# Patient Record
Sex: Female | Born: 1985 | Race: White | Hispanic: No | Marital: Married | Smoking: Current every day smoker
Health system: Southern US, Community
[De-identification: ages and names within clinical notes are randomized; demographics above are authoritative.]

## PROBLEM LIST (undated history)

## (undated) DIAGNOSIS — N200 Calculus of kidney: Secondary | ICD-10-CM

## (undated) DIAGNOSIS — S0990XA Unspecified injury of head, initial encounter: Secondary | ICD-10-CM

---

## 2013-03-07 ENCOUNTER — Emergency Department (HOSPITAL_COMMUNITY)
Admission: EM | Admit: 2013-03-07 | Discharge: 2013-03-07 | Disposition: A | Discharge status: Home / Self Care | Payer: Self-pay | Attending: Emergency Medicine | Admitting: Emergency Medicine

## 2013-03-07 ENCOUNTER — Encounter (HOSPITAL_COMMUNITY): Payer: Self-pay

## 2013-03-07 DIAGNOSIS — Z87442 Personal history of urinary calculi: Secondary | ICD-10-CM | POA: Insufficient documentation

## 2013-03-07 DIAGNOSIS — Z880 Allergy status to penicillin: Secondary | ICD-10-CM | POA: Insufficient documentation

## 2013-03-07 DIAGNOSIS — Z3202 Encounter for pregnancy test, result negative: Secondary | ICD-10-CM | POA: Insufficient documentation

## 2013-03-07 DIAGNOSIS — N898 Other specified noninflammatory disorders of vagina: Secondary | ICD-10-CM | POA: Insufficient documentation

## 2013-03-07 DIAGNOSIS — F17200 Nicotine dependence, unspecified, uncomplicated: Secondary | ICD-10-CM | POA: Insufficient documentation

## 2013-03-07 DIAGNOSIS — N12 Tubulo-interstitial nephritis, not specified as acute or chronic: Secondary | ICD-10-CM | POA: Insufficient documentation

## 2013-03-07 HISTORY — DX: Calculus of kidney: N20.0

## 2013-03-07 LAB — WET PREP, GENITAL
Trich, Wet Prep: NONE SEEN
WBC, Wet Prep HPF POC: NONE SEEN
Yeast Wet Prep HPF POC: NONE SEEN

## 2013-03-07 LAB — URINALYSIS, ROUTINE W REFLEX MICROSCOPIC
Glucose, UA: NEGATIVE mg/dL
Ketones, ur: NEGATIVE mg/dL
Protein, ur: NEGATIVE mg/dL
Urobilinogen, UA: 0.2 mg/dL (ref 0.0–1.0)

## 2013-03-07 LAB — URINE MICROSCOPIC-ADD ON

## 2013-03-07 LAB — PREGNANCY, URINE: Preg Test, Ur: NEGATIVE

## 2013-03-07 MED ORDER — IBUPROFEN 600 MG PO TABS: 600.0000 mg | ORAL_TABLET | Freq: Four times a day (QID) | ORAL | Status: DC | PRN

## 2013-03-07 MED ORDER — HYDROCODONE-ACETAMINOPHEN 5-325 MG PO TABS
2.0000 | ORAL_TABLET | Freq: Once | ORAL | Status: AC
  Administered 2013-03-07: 2 via ORAL
  Filled 2013-03-07: qty 2

## 2013-03-07 MED ORDER — CIPROFLOXACIN HCL 500 MG PO TABS: 500.0000 mg | ORAL_TABLET | Freq: Two times a day (BID) | ORAL | Status: DC

## 2013-03-07 MED ORDER — KETOROLAC TROMETHAMINE 60 MG/2ML IM SOLN
60.0000 mg | Freq: Once | INTRAMUSCULAR | Status: AC
  Administered 2013-03-07: 60 mg via INTRAMUSCULAR
  Filled 2013-03-07: qty 2

## 2013-03-07 MED ORDER — PHENAZOPYRIDINE HCL 200 MG PO TABS: 200.0000 mg | ORAL_TABLET | Freq: Three times a day (TID) | ORAL | Status: DC

## 2013-03-07 NOTE — ED Notes (Signed)
Onset started 1 week ago with dysuria and right flank pain. Hx of kidney stone, no blood in urine. White vaginal discharge. VSS

## 2013-03-07 NOTE — ED Provider Notes (Signed)
Medical screening examination/treatment/procedure(s) were performed by non-physician practitioner and as supervising physician I was immediately available for consultation/collaboration.   Gavin Pound. Dorthey Depace, MD 03/07/13 1428

## 2013-03-07 NOTE — ED Notes (Signed)
She tells me she has had right flank and rlq area pain plus dysuria x ~ 4 days.  She further tells me she had a labial abscess which with the assistance of her significant other, was able to "pop" 3 days ago, and "that feels better now".  She also tells me she has had an intermittant, thick "whitish" vaginal d/c "for about a month now".  She denies fever/h/a/n/v/d and is in no distress.  She also c/o mild u.r.i. Sx for past few days.

## 2013-03-07 NOTE — ED Provider Notes (Signed)
CSN: [id number]     Arrival date & time 03/07/13  1220 History   First MD Initiated Contact with Patient 03/07/13 1235     Chief Complaint  Patient presents with  . Dysuria  . Back Pain   (Consider location/radiation/quality/duration/timing/severity/associated sxs/prior Treatment) HPI Pt is a 27yo female c/o dysuria. Pt reports burning with urination, associated with right sided flank pain for about 4 days.  Flank pain is intermittent throbbing 5/10, nothing makes it better or worse. Pt reports hx of labial abscess 1 week ago, which her significant other helped her "pop" 3 days ago, causing green discharge to come out.  Reports pain from that has resolved.   Pt also reports malodorous white discharge intermittently for x1-23mo now.  Pt read on the Internet it could be a bacterial infection so she tried to change her diet, which seemed to help at first but it's still not completely resolved.  Does report hx of kidney stone and states right flank pain feels similar.  Denies fever but reports mild URI symptoms for last few days. Denies vomiting or diarrhea. LMP: ended 3 days ago, nl per pt.    Past Medical History  Diagnosis Date  . Kidney stone    Past Surgical History  Procedure Laterality Date  . Cesarean section     History reviewed. No pertinent family history. History  Substance Use Topics  . Smoking status: Current Every Day Smoker  . Smokeless tobacco: Not on file  . Alcohol Use: No   OB History   Grav Para Term Preterm Abortions TAB SAB Ect Mult Living                 Review of Systems  Constitutional: Negative for fever and chills.  Respiratory: Negative for cough.   Cardiovascular: Negative for chest pain.  Gastrointestinal: Positive for nausea. Negative for vomiting, diarrhea and constipation.  Genitourinary: Positive for dysuria, frequency and vaginal discharge. Negative for hematuria, vaginal bleeding, vaginal pain and pelvic pain.  Musculoskeletal: Positive for back  pain ( right flank).  All other systems reviewed and are negative.    Allergies  Penicillins  Home Medications   Current Outpatient Rx  Name  Route  Sig  Dispense  Refill  . Aspirin-Acetaminophen-Caffeine (GOODY HEADACHE PO)   Oral   Take 1 packet by mouth daily as needed (HEADACHES).         . diphenhydrAMINE (BENADRYL) 25 MG tablet   Oral   Take 25 mg by mouth every 6 (six) hours as needed for itching.         . ciprofloxacin (CIPRO) 500 MG tablet   Oral   Take 1 tablet (500 mg total) by mouth every 12 (twelve) hours.   20 tablet   0   . ibuprofen (ADVIL,MOTRIN) 600 MG tablet   Oral   Take 1 tablet (600 mg total) by mouth every 6 (six) hours as needed for pain.   30 tablet   0   . phenazopyridine (PYRIDIUM) 200 MG tablet   Oral   Take 1 tablet (200 mg total) by mouth 3 (three) times daily.   6 tablet   0    BP 114/68  Pulse 81  Temp(Src) 97.5 F (36.4 C) (Oral)  Resp 18  SpO2 100%  LMP 03/01/2013 Physical Exam  Nursing note and vitals reviewed. Constitutional: She appears well-developed and well-nourished. No distress.  Pt lying comfortably in exam bed, NAD.    HENT:  Head: Normocephalic and atraumatic.  Eyes: Conjunctivae are normal. No scleral icterus.  Neck: Normal range of motion.  Cardiovascular: Normal rate, regular rhythm and normal heart sounds.   Pulmonary/Chest: Effort normal and breath sounds normal. No respiratory distress. She has no wheezes. She has no rales. She exhibits no tenderness.  Abdominal: Soft. Bowel sounds are normal. She exhibits no distension and no mass. There is tenderness ( mild, right flank and RLQ). There is no rebound and no guarding.  Genitourinary: Vagina normal and uterus normal. Pelvic exam was performed with patient supine. No labial fusion. There is no rash, tenderness, lesion or injury on the right labia. There is no rash, tenderness, lesion or injury on the left labia. Cervix exhibits discharge ( scant, white).  Cervix exhibits no motion tenderness and no friability. Right adnexum displays tenderness. Right adnexum displays no mass and no fullness. Left adnexum displays no mass, no tenderness and no fullness. No erythema, tenderness or bleeding around the vagina. No foreign body around the vagina. No signs of injury around the vagina. No vaginal discharge found.  Nl external exam, no erythema, tenderness or lesions. Internal: scant white discharge, no blood. No CMT adnexal tenderness. Mild right adnexal tenderness.  Musculoskeletal: Normal range of motion.  Neurological: She is alert.  Skin: Skin is warm and dry. She is not diaphoretic.    ED Course  Procedures (including critical care time) Labs Review Labs Reviewed  WET PREP, GENITAL - Abnormal; Notable for the following:    Clue Cells Wet Prep HPF POC FEW (*)    All other components within normal limits  URINALYSIS, ROUTINE W REFLEX MICROSCOPIC - Abnormal; Notable for the following:    APPearance TURBID (*)    Hgb urine dipstick TRACE (*)    Nitrite POSITIVE (*)    All other components within normal limits  URINE MICROSCOPIC-ADD ON - Abnormal; Notable for the following:    Squamous Epithelial / LPF MANY (*)    Bacteria, UA MANY (*)    All other components within normal limits  GC/CHLAMYDIA PROBE AMP  URINE CULTURE  PREGNANCY, URINE   Imaging Review No results found.  MDM   1. Pyelonephritis     Symptoms and UA consistent with pyelonephritis.  Pt appears non-toxic, vitals WNL at this time. Pt may be discharged home for outpatient tx. Will tx and send urine culture. Wet prep: unremarkable.    Rx: Cipro, pyridium and ibuprofen. All labs/imaging/findings discussed with patient. All questions answered and concerns addressed. Will discharge pt home and have pt f/u with [facility] info provided. Return precautions given. Pt verbalized understanding and agreement with tx plan. Vitals: unremarkable. Discharged in stable  condition.    Discussed pt with attending during ED encounter and agrees with plan.     Junius Finner, PA-C 03/07/13 1426

## 2013-03-08 LAB — GC/CHLAMYDIA PROBE AMP
CT Probe RNA: NEGATIVE
GC Probe RNA: NEGATIVE

## 2013-03-09 LAB — URINE CULTURE: Colony Count: >=100000

## 2013-04-15 ENCOUNTER — Encounter (HOSPITAL_COMMUNITY): Payer: Self-pay | Admitting: Emergency Medicine

## 2013-04-15 ENCOUNTER — Emergency Department (HOSPITAL_COMMUNITY)
Admission: EM | Admit: 2013-04-15 | Discharge: 2013-04-15 | Discharge status: Against Medical Advice | Payer: Self-pay | Attending: Emergency Medicine | Admitting: Emergency Medicine

## 2013-04-15 DIAGNOSIS — N898 Other specified noninflammatory disorders of vagina: Secondary | ICD-10-CM | POA: Insufficient documentation

## 2013-04-15 DIAGNOSIS — N644 Mastodynia: Secondary | ICD-10-CM | POA: Insufficient documentation

## 2013-04-15 DIAGNOSIS — F17200 Nicotine dependence, unspecified, uncomplicated: Secondary | ICD-10-CM | POA: Insufficient documentation

## 2013-04-15 NOTE — ED Notes (Signed)
Unable to find pt. In our w.r.

## 2013-04-15 NOTE — ED Notes (Signed)
Pt states for the past 3 months she has been having abnormal vaginal bleeding between periods, breast soreness that goes all the way down body, states she's new to [place] and doesn't have insurance or an ob/gyn.

## 2013-04-15 NOTE — ED Notes (Signed)
Unable to locate pt. In our lobby.

## 2013-06-13 ENCOUNTER — Emergency Department (HOSPITAL_COMMUNITY)
Admission: EM | Admit: 2013-06-13 | Discharge: 2013-06-13 | Discharge status: Against Medical Advice | Payer: Self-pay | Attending: Emergency Medicine | Admitting: Emergency Medicine

## 2013-06-13 ENCOUNTER — Encounter (HOSPITAL_COMMUNITY): Payer: Self-pay | Admitting: Emergency Medicine

## 2013-06-13 DIAGNOSIS — N898 Other specified noninflammatory disorders of vagina: Secondary | ICD-10-CM | POA: Insufficient documentation

## 2013-06-13 DIAGNOSIS — Z880 Allergy status to penicillin: Secondary | ICD-10-CM | POA: Insufficient documentation

## 2013-06-13 DIAGNOSIS — R42 Dizziness and giddiness: Secondary | ICD-10-CM | POA: Insufficient documentation

## 2013-06-13 DIAGNOSIS — F17200 Nicotine dependence, unspecified, uncomplicated: Secondary | ICD-10-CM | POA: Insufficient documentation

## 2013-06-13 DIAGNOSIS — N939 Abnormal uterine and vaginal bleeding, unspecified: Secondary | ICD-10-CM

## 2013-06-13 DIAGNOSIS — Z87442 Personal history of urinary calculi: Secondary | ICD-10-CM | POA: Insufficient documentation

## 2013-06-13 NOTE — ED Notes (Signed)
Pt in bathroom, unavailable for blood draw.

## 2013-06-13 NOTE — ED Notes (Signed)
Pt states for the last 3 months, she has her regular cycle followed by a restart of lite bleeding a few days later for about a week. Last cycle a week ago, started spotting again yesterday

## 2013-06-13 NOTE — ED Notes (Signed)
Patient not present in room for over thirty minutes. Staff checked all bathrooms and did not find patient. Several tests remain undone due to patient leaving ED prior to discharge. Horton, MD, made aware; Johnny Bridge, charge RN made aware.

## 2013-06-13 NOTE — ED Notes (Signed)
Bed: ZO10 Expected date:  Expected time:  Means of arrival:  Comments: Pt dressing

## 2013-06-13 NOTE — ED Notes (Signed)
Patient still not present in room, gown left in room.

## 2013-06-13 NOTE — ED Provider Notes (Signed)
CSN: [id number]     Arrival date & time 06/13/13  1234 History   First MD Initiated Contact with Patient 06/13/13 1324     Chief Complaint  Patient presents with  . Vaginal Bleeding   (Consider location/radiation/quality/duration/timing/severity/associated sxs/prior Treatment) HPI  This is a 26 year old female who presents with vaginal bleeding. Patient reports 3 month history of irregular vaginal bleeding. She states that she has a normal period followed by absence of bleeding and then return of light spotting. She reports last normal menstrual period was one week ago. She reports onset of vaginal spotting last night. She also reports cramping of the lower abdomen. She is not taking anything at home for this. She does have an Implanon which she states was placed in 2008. She reports negative home pregnancy tests. She denies any syncope but does endorse dizziness.  Past Medical History  Diagnosis Date  . Kidney stone    Past Surgical History  Procedure Laterality Date  . Cesarean section     No family history on file. History  Substance Use Topics  . Smoking status: Current Every Day Smoker  . Smokeless tobacco: Not on file  . Alcohol Use: No   OB History   Grav Para Term Preterm Abortions TAB SAB Ect Mult Living                 Review of Systems  Constitutional: Negative for fever.  Respiratory: Negative for cough, chest tightness and shortness of breath.   Cardiovascular: Negative for chest pain.  Gastrointestinal: Positive for abdominal pain. Negative for nausea and vomiting.  Genitourinary: Positive for vaginal bleeding. Negative for dysuria, urgency, frequency and vaginal discharge.  Musculoskeletal: Negative for back pain.  Skin: Negative for wound.  Neurological: Positive for dizziness. Negative for headaches.  Psychiatric/Behavioral: Negative for confusion.  All other systems reviewed and are negative.    Allergies  Penicillins  Home Medications   Current  Outpatient Rx  Name  Route  Sig  Dispense  Refill  . diphenhydrAMINE (BENADRYL) 25 MG tablet   Oral   Take 25 mg by mouth every 6 (six) hours as needed for itching or allergies.           BP 104/57  Pulse 58  Temp(Src) 98.7 F (37.1 C) (Oral)  Resp 16  Wt 150 lb (68.04 kg)  SpO2 100%  LMP 06/03/2013 Physical Exam  Nursing note and vitals reviewed. Constitutional: She is oriented to person, place, and time. She appears well-developed and well-nourished. No distress.  HENT:  Head: Normocephalic and atraumatic.  Eyes: Pupils are equal, round, and reactive to light.  Neck: Neck supple.  Cardiovascular: Normal rate, regular rhythm and normal heart sounds.   No murmur heard. Pulmonary/Chest: Effort normal and breath sounds normal. No respiratory distress. She has no wheezes.  Abdominal: Soft. Bowel sounds are normal. There is no tenderness.  Neurological: She is alert and oriented to person, place, and time.  Skin: Skin is warm and dry.  Psychiatric: She has a normal mood and affect.    ED Course  Procedures (including critical care time) Labs Review Labs Reviewed  CBC WITH DIFFERENTIAL  BASIC METABOLIC PANEL  URINALYSIS, ROUTINE W REFLEX MICROSCOPIC   Imaging Review No results found.  EKG Interpretation   None       MDM  No diagnosis found.  Patient presents with complaints of irregular vaginal bleeding. She does also endorse dizziness without syncope. She is nontoxic-appearing on exam and vital signs are reassuring.  Workup initiated with lab work and orthostatic vital signs. Patient appears to have eloped prior to pelvic exam and formal evaluation.    Shon Baton, MD 06/13/13 [phone]

## 2013-09-21 ENCOUNTER — Encounter (HOSPITAL_COMMUNITY): Payer: Self-pay | Admitting: Emergency Medicine

## 2013-09-21 ENCOUNTER — Emergency Department (HOSPITAL_COMMUNITY)
Admission: EM | Admit: 2013-09-21 | Discharge: 2013-09-21 | Disposition: A | Discharge status: Home / Self Care | Payer: Self-pay | Attending: Emergency Medicine | Admitting: Emergency Medicine

## 2013-09-21 ENCOUNTER — Emergency Department (HOSPITAL_COMMUNITY): Payer: Self-pay

## 2013-09-21 DIAGNOSIS — Y9389 Activity, other specified: Secondary | ICD-10-CM | POA: Insufficient documentation

## 2013-09-21 DIAGNOSIS — Z791 Long term (current) use of non-steroidal anti-inflammatories (NSAID): Secondary | ICD-10-CM | POA: Insufficient documentation

## 2013-09-21 DIAGNOSIS — Z880 Allergy status to penicillin: Secondary | ICD-10-CM | POA: Insufficient documentation

## 2013-09-21 DIAGNOSIS — M25531 Pain in right wrist: Secondary | ICD-10-CM

## 2013-09-21 DIAGNOSIS — S59919A Unspecified injury of unspecified forearm, initial encounter: Principal | ICD-10-CM

## 2013-09-21 DIAGNOSIS — S6990XA Unspecified injury of unspecified wrist, hand and finger(s), initial encounter: Principal | ICD-10-CM | POA: Insufficient documentation

## 2013-09-21 DIAGNOSIS — S59909A Unspecified injury of unspecified elbow, initial encounter: Secondary | ICD-10-CM | POA: Insufficient documentation

## 2013-09-21 DIAGNOSIS — W208XXA Other cause of strike by thrown, projected or falling object, initial encounter: Secondary | ICD-10-CM | POA: Insufficient documentation

## 2013-09-21 DIAGNOSIS — Z87442 Personal history of urinary calculi: Secondary | ICD-10-CM | POA: Insufficient documentation

## 2013-09-21 DIAGNOSIS — Y990 Civilian activity done for income or pay: Secondary | ICD-10-CM | POA: Insufficient documentation

## 2013-09-21 DIAGNOSIS — F17200 Nicotine dependence, unspecified, uncomplicated: Secondary | ICD-10-CM | POA: Insufficient documentation

## 2013-09-21 DIAGNOSIS — Y9289 Other specified places as the place of occurrence of the external cause: Secondary | ICD-10-CM | POA: Insufficient documentation

## 2013-09-21 MED ORDER — ACETAMINOPHEN-CODEINE #3 300-30 MG PO TABS
1.0000 | ORAL_TABLET | Freq: Once | ORAL | Status: AC
  Administered 2013-09-21: 1 via ORAL
  Filled 2013-09-21: qty 1

## 2013-09-21 MED ORDER — IBUPROFEN 800 MG PO TABS: 800.0000 mg | ORAL_TABLET | Freq: Three times a day (TID) | ORAL | Status: DC

## 2013-09-21 MED ORDER — ACETAMINOPHEN-CODEINE #3 300-30 MG PO TABS: 1.0000 | ORAL_TABLET | Freq: Four times a day (QID) | ORAL | Status: AC | PRN

## 2013-09-21 NOTE — ED Provider Notes (Signed)
CSN: [id number]     Arrival date & time 09/21/13  1748 History   First MD Initiated Contact with Patient 09/21/13 1913     This chart was scribed for non-physician practitioner, Francee Piccolo PA-C working with Nelia Shi, MD by Arlan Organ, ED Scribe. This patient was seen in room WTR5/WTR5 and the patient's care was started at 7:18 PM.   Chief Complaint  Patient presents with  . Arm Injury    The history is provided by the patient. No language interpreter was used.    HPI Comments: Vanessa Carney is a 28 y.o. female who presents to the Emergency Department complaining of constant, moderate R arm pain x 8 hours that is is unchanged. She describes this pain as sore. Pt states a car battery fell on her arm while she was at work. She has not tried anything OTC for the discomfort. Denies any recent or previous surgeries, however, she admits to breaking her R thumb as a child. At this time she denies any fever, chills, or weakness. No other concerns this visit.  Past Medical History  Diagnosis Date  . Kidney stone    Past Surgical History  Procedure Laterality Date  . Cesarean section     History reviewed. No pertinent family history. History  Substance Use Topics  . Smoking status: Current Every Day Smoker  . Smokeless tobacco: Not on file  . Alcohol Use: No   OB History   Grav Para Term Preterm Abortions TAB SAB Ect Mult Living                 Review of Systems  Constitutional: Negative for fever and chills.  HENT: Negative for congestion.   Eyes: Negative for redness.  Respiratory: Negative for cough.   Musculoskeletal: Positive for arthralgias.  Skin: Negative for rash.  Neurological: Negative for weakness.      Allergies  Penicillins  Home Medications   Current Outpatient Rx  Name  Route  Sig  Dispense  Refill  . acetaminophen-codeine (TYLENOL #3) 300-30 MG per tablet   Oral   Take 1-2 tablets by mouth every 6 (six) hours as needed for severe pain.    10 tablet   0   . ibuprofen (ADVIL,MOTRIN) 800 MG tablet   Oral   Take 1 tablet (800 mg total) by mouth 3 (three) times daily.   21 tablet   0     Triage Vitals: BP 106/76  Pulse 75  Temp(Src) 98.1 F (36.7 C) (Oral)  SpO2 100%  LMP 09/14/2013   Physical Exam  Nursing note and vitals reviewed. Constitutional: She is oriented to person, place, and time. She appears well-developed and well-nourished. No distress.  HENT:  Head: Normocephalic and atraumatic.  Right Ear: External ear normal.  Left Ear: External ear normal.  Nose: Nose normal.  Mouth/Throat: Oropharynx is clear and moist.  Eyes: Conjunctivae are normal.  Neck: Normal range of motion. Neck supple.  Cardiovascular: Normal rate, regular rhythm, normal heart sounds and intact distal pulses.   Pulmonary/Chest: Effort normal and breath sounds normal. No respiratory distress. She has no wheezes. She has no rales.  Abdominal: Soft.  Musculoskeletal: Normal range of motion.       Right elbow: Normal.      Left elbow: Normal.       Right wrist: She exhibits tenderness. She exhibits normal range of motion, no bony tenderness, no swelling, no deformity and no laceration.       Left wrist:  Normal.       Right forearm: She exhibits tenderness. She exhibits no bony tenderness, no swelling and no deformity.       Left forearm: Normal.       Right hand: Normal.       Left hand: Normal.  Neurological: She is alert and oriented to person, place, and time.  Sensation grossly intact  Skin: Skin is warm and dry. She is not diaphoretic.  Psychiatric: She has a normal mood and affect.    ED Course  Procedures (including critical care time)  DIAGNOSTIC STUDIES: Oxygen Saturation is 100% on RA, Normal by my interpretation.    COORDINATION OF CARE: 7:19 PM- Will give Tylenol #3. Will order DG Forearm. Will prescribe Tylenol #3 and Advil at discharge. Discussed treatment plan with pt at bedside and pt agreed to plan.     Labs  Review Labs Reviewed - No data to display Imaging Review Dg Forearm Right  09/21/2013   CLINICAL DATA:  Dropped car battery on right forearm, forearm pain  EXAM: RIGHT FOREARM - 2 VIEW  COMPARISON:  None  FINDINGS: Osseous mineralization normal.  Joint spaces preserved.  No acute fracture, dislocation or bone destruction.  IMPRESSION: No acute osseous abnormalities.   Electronically Signed   By: Ulyses SouthwardMark  Boles M.D.   On: 09/21/2013 18:57     EKG Interpretation None      MDM   Final diagnoses:  Right wrist pain    Filed Vitals:   09/21/13 1826  BP: 106/76  Pulse: 75  Temp: 98.1 F (36.7 C)   Afebrile, NAD, non-toxic appearing, AAOx4.  Neurovascularly intact. Normal sensation. Imaging shows no fracture. Directed pt to ice injury, take acetaminophen or ibuprofen for pain, and to elevate and rest the injury when possible. Splinted wrist for support and comfort. Return precautions discussed. Patient agreeable to plan. Patient stable to be discharged.  I personally performed the services described in this documentation, which was scribed in my presence. The recorded information has been reviewed and is accurate.    Jeannetta EllisJennifer L Vayda Dungee, PA-C 09/21/13 2010

## 2013-09-21 NOTE — ED Notes (Signed)
Pt states she was at work when she dropped a car battery on her R arm and now has pain. Does not want to file worker's comp. Alert and oriented. Ambulatory.

## 2013-09-21 NOTE — ED Notes (Signed)
Ortho tech called for application of wrist splint.  

## 2013-09-21 NOTE — Discharge Instructions (Signed)
Please follow up with your primary care physician in 1-2 days. If you do not have one please call the [facility] number listed above. Please take pain medication and/or muscle relaxants as prescribed and as needed for pain. Please do not drive on narcotic pain medication or on muscle relaxants. Please follow RICE method below. Please read all discharge instructions and return precautions.   Wrist Pain Wrist injuries are frequent in adults and children. A sprain is an injury to the ligaments that hold your bones together. A strain is an injury to muscle or muscle cord-like structures (tendons) from stretching or pulling. Generally, when wrists are moderately tender to touch following a fall or injury, a break in the bone (fracture) may be present. Most wrist sprains or strains are better in 3 to 5 days, but complete healing may take several weeks. HOME CARE INSTRUCTIONS   Put ice on the injured area.  Put ice in a plastic bag.  Place a towel between your skin and the bag.  Leave the ice on for 15-20 minutes, 03-04 times a day, for the first 2 days.  Keep your arm raised above the level of your heart whenever possible to reduce swelling and pain.  Rest the injured area for at least 48 hours or as directed by your caregiver.  If a splint or elastic bandage has been applied, use it for as long as directed by your caregiver or until seen by a caregiver for a follow-up exam.  Only take over-the-counter or prescription medicines for pain, discomfort, or fever as directed by your caregiver.  Keep all follow-up appointments. You may need to follow up with a specialist or have follow-up X-rays. Improvement in pain level is not a guarantee that you did not fracture a bone in your wrist. The only way to determine whether or not you have a broken bone is by X-ray. SEEK IMMEDIATE MEDICAL CARE IF:   Your fingers are swollen, very red, white, or cold and blue.  Your fingers are numb  or tingling.  You have increasing pain.  You have difficulty moving your fingers. MAKE SURE YOU:   Understand these instructions.  Will watch your condition.  Will get help right away if you are not doing well or get worse. Document Released: 03/14/2005 Document Revised: 08/27/2011 Document Reviewed: 07/26/2010 [facility] Patient Information 2014 [employer] [place].  RICE: Routine Care for Injuries The routine care of many injuries includes Rest, Ice, Compression, and Elevation (RICE). HOME CARE INSTRUCTIONS  Rest is needed to allow your body to heal. Routine activities can usually be resumed when comfortable. Injured tendons and bones can take up to 6 weeks to heal. Tendons are the cord-like structures that attach muscle to bone.  Ice following an injury helps keep the swelling down and reduces pain.  Put ice in a plastic bag.  Place a towel between your skin and the bag.  Leave the ice on for 15-20 minutes, 03-04 times a day. Do this while awake, for the first 24 to 48 hours. After that, continue as directed by your caregiver.  Compression helps keep swelling down. It also gives support and helps with discomfort. If an elastic bandage has been applied, it should be removed and reapplied every 3 to 4 hours. It should not be applied tightly, but firmly enough to keep swelling down. Watch fingers or toes for swelling, bluish discoloration, coldness, numbness, or excessive pain. If any of these problems occur, remove the bandage and reapply  loosely. Contact your caregiver if these problems continue.  Elevation helps reduce swelling and decreases pain. With extremities, such as the arms, hands, legs, and feet, the injured area should be placed near or above the level of the heart, if possible. SEEK IMMEDIATE MEDICAL CARE IF:  You have persistent pain and swelling.  You develop redness, numbness, or unexpected weakness.  Your symptoms are getting worse rather than improving after several  days. These symptoms may indicate that further evaluation or further X-rays are needed. Sometimes, X-rays may not show a small broken bone (fracture) until 1 week or 10 days later. Make a follow-up appointment with your caregiver. Ask when your X-ray results will be ready. Make sure you get your X-ray results. Document Released: 09/16/2000 Document Revised: 08/27/2011 Document Reviewed: 11/03/2010 [facility] Patient Information 2014 [place], [place].

## 2013-09-21 NOTE — ED Notes (Signed)
Pt ambulatory to exam room with steady gait.  

## 2013-09-26 NOTE — ED Provider Notes (Signed)
Medical screening examination/treatment/procedure(s) were performed by non-physician practitioner and as supervising physician I was immediately available for consultation/collaboration.   Mateus Rewerts L Kaytlen Lightsey, MD 09/26/13 1118 

## 2013-12-21 ENCOUNTER — Encounter (HOSPITAL_COMMUNITY): Payer: Self-pay | Admitting: Emergency Medicine

## 2013-12-21 ENCOUNTER — Emergency Department (HOSPITAL_COMMUNITY)
Admission: EM | Admit: 2013-12-21 | Discharge: 2013-12-21 | Discharge status: Against Medical Advice | Payer: Self-pay | Attending: Emergency Medicine | Admitting: Emergency Medicine

## 2013-12-21 DIAGNOSIS — F17200 Nicotine dependence, unspecified, uncomplicated: Secondary | ICD-10-CM | POA: Insufficient documentation

## 2013-12-21 DIAGNOSIS — Z8679 Personal history of other diseases of the circulatory system: Secondary | ICD-10-CM | POA: Insufficient documentation

## 2013-12-21 DIAGNOSIS — R51 Headache: Secondary | ICD-10-CM | POA: Insufficient documentation

## 2013-12-21 DIAGNOSIS — R040 Epistaxis: Secondary | ICD-10-CM | POA: Insufficient documentation

## 2013-12-21 DIAGNOSIS — H9209 Otalgia, unspecified ear: Secondary | ICD-10-CM | POA: Insufficient documentation

## 2013-12-21 DIAGNOSIS — R519 Headache, unspecified: Secondary | ICD-10-CM

## 2013-12-21 DIAGNOSIS — Z87828 Personal history of other (healed) physical injury and trauma: Secondary | ICD-10-CM | POA: Insufficient documentation

## 2013-12-21 DIAGNOSIS — Z880 Allergy status to penicillin: Secondary | ICD-10-CM | POA: Insufficient documentation

## 2013-12-21 DIAGNOSIS — Z791 Long term (current) use of non-steroidal anti-inflammatories (NSAID): Secondary | ICD-10-CM | POA: Insufficient documentation

## 2013-12-21 DIAGNOSIS — Z87442 Personal history of urinary calculi: Secondary | ICD-10-CM | POA: Insufficient documentation

## 2013-12-21 HISTORY — DX: Unspecified injury of head, initial encounter: S09.90XA

## 2013-12-21 MED ORDER — KETOROLAC TROMETHAMINE 60 MG/2ML IM SOLN: 30.0000 mg | Freq: Once | INTRAMUSCULAR | Status: DC

## 2013-12-21 MED ORDER — DIPHENHYDRAMINE HCL 25 MG PO CAPS: 25.0000 mg | ORAL_CAPSULE | Freq: Once | ORAL | Status: DC

## 2013-12-21 MED ORDER — METOCLOPRAMIDE HCL 10 MG PO TABS: 10.0000 mg | ORAL_TABLET | Freq: Once | ORAL | Status: DC

## 2013-12-21 NOTE — ED Notes (Signed)
Pt states "I'm hungry and I have to work in the morning, [place]."

## 2013-12-21 NOTE — ED Notes (Signed)
shes had a headache all week then yesterday her R ear began to ache and today she had a nosebleed at work. No bleeding now. A&ox4, breathing easily
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
## 2013-12-22 NOTE — ED Provider Notes (Signed)
Medical screening examination/treatment/procedure(s) were performed by non-physician practitioner and as supervising physician I was immediately available for consultation/collaboration.    Ariannah Arenson, MD 12/22/13 0050 

## 2014-08-25 ENCOUNTER — Encounter (HOSPITAL_COMMUNITY): Payer: Self-pay | Admitting: Emergency Medicine

## 2014-08-25 ENCOUNTER — Emergency Department (HOSPITAL_COMMUNITY)
Admission: EM | Admit: 2014-08-25 | Discharge: 2014-08-25 | Disposition: A | Discharge status: Home / Self Care | Payer: Self-pay | Attending: Emergency Medicine | Admitting: Emergency Medicine

## 2014-08-25 ENCOUNTER — Emergency Department (HOSPITAL_COMMUNITY): Payer: Self-pay

## 2014-08-25 DIAGNOSIS — J988 Other specified respiratory disorders: Secondary | ICD-10-CM | POA: Insufficient documentation

## 2014-08-25 DIAGNOSIS — Z880 Allergy status to penicillin: Secondary | ICD-10-CM | POA: Insufficient documentation

## 2014-08-25 DIAGNOSIS — Z720 Tobacco use: Secondary | ICD-10-CM | POA: Insufficient documentation

## 2014-08-25 DIAGNOSIS — Z7982 Long term (current) use of aspirin: Secondary | ICD-10-CM | POA: Insufficient documentation

## 2014-08-25 DIAGNOSIS — Z87828 Personal history of other (healed) physical injury and trauma: Secondary | ICD-10-CM | POA: Insufficient documentation

## 2014-08-25 DIAGNOSIS — R52 Pain, unspecified: Secondary | ICD-10-CM | POA: Insufficient documentation

## 2014-08-25 DIAGNOSIS — Z87442 Personal history of urinary calculi: Secondary | ICD-10-CM | POA: Insufficient documentation

## 2014-08-25 DIAGNOSIS — Z79899 Other long term (current) drug therapy: Secondary | ICD-10-CM | POA: Insufficient documentation

## 2014-08-25 LAB — I-STAT CHEM 8, ED
BUN: 4 mg/dL — ABNORMAL LOW (ref 6–23)
Calcium, Ion: 1.17 mmol/L (ref 1.12–1.23)
Chloride: 96 mmol/L (ref 96–112)
Creatinine, Ser: 0.5 mg/dL (ref 0.50–1.10)
Glucose, Bld: 98 mg/dL (ref 70–99)
HCT: 48 % — ABNORMAL HIGH (ref 36.0–46.0)
Hemoglobin: 16.3 g/dL — ABNORMAL HIGH (ref 12.0–15.0)
Potassium: 3.1 mmol/L — ABNORMAL LOW (ref 3.5–5.1)
Sodium: 137 mmol/L (ref 135–145)
TCO2: 24 mmol/L (ref 0–100)

## 2014-08-25 MED ORDER — POTASSIUM CHLORIDE CRYS ER 20 MEQ PO TBCR
40.0000 meq | EXTENDED_RELEASE_TABLET | Freq: Once | ORAL | Status: AC
  Administered 2014-08-25: 40 meq via ORAL
  Filled 2014-08-25: qty 2

## 2014-08-25 MED ORDER — IBUPROFEN 800 MG PO TABS
800.0000 mg | ORAL_TABLET | Freq: Once | ORAL | Status: AC
  Administered 2014-08-25: 800 mg via ORAL
  Filled 2014-08-25: qty 1

## 2014-08-25 MED ORDER — ONDANSETRON 4 MG PO TBDP
4.0000 mg | ORAL_TABLET | Freq: Once | ORAL | Status: AC
  Administered 2014-08-25: 4 mg via ORAL
  Filled 2014-08-25: qty 1
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
## 2014-08-25 NOTE — Discharge Instructions (Signed)
Viral Infections A virus is a type of germ. Viruses can cause:  Minor sore throats.  Aches and pains.  Headaches.  Runny nose.  Rashes.  Watery eyes.  Tiredness.  Coughs.  Loss of appetite.  Feeling sick to your stomach (nausea).  Throwing up (vomiting).  Watery poop (diarrhea). HOME CARE   Only take medicines as told by your doctor.  Drink enough water and fluids to keep your pee (urine) clear or pale yellow. Sports drinks are a good choice.  Get plenty of rest and eat healthy. Soups and broths with crackers or rice are fine. GET HELP RIGHT AWAY IF:   You have a very bad headache.  You have shortness of breath.  You have chest pain or neck pain.  You have an unusual rash.  You cannot stop throwing up.  You have watery poop that does not stop.  You cannot keep fluids down.  You or your child has a temperature by mouth above 102 F (38.9 C), not controlled by medicine.  Your baby is older than 3 months with a rectal temperature of 102 F (38.9 C) or higher.  Your baby is 803 months old or younger with a rectal temperature of 100.4 F (38 C) or higher. MAKE SURE YOU:   Understand these instructions.  Will watch this condition.  Will get help right away if you are not doing well or get worse. Document Released: 05/17/2008 Document Revised: 08/27/2011 Document Reviewed: 10/10/2010 [facility] Patient Information 2015 [place]. This information is not intended to replace advice given to you by your health care provider. Make sure you discuss any questions you have with your health care provider.  Pt advised to follow up with PCP in three days for re-check of symptoms. If symptoms worsen please seek medical attention sooner. Please drink plenty of fluids, rest, and return as needed.

## 2014-08-25 NOTE — ED Notes (Signed)
Pt c/o body aches, cough and fever x 3 days. Pt denies taking any meds for her symptoms but did take  BC powder yesterday.
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
## 2015-04-18 ENCOUNTER — Encounter (HOSPITAL_COMMUNITY): Payer: Self-pay | Admitting: Emergency Medicine

## 2015-04-18 ENCOUNTER — Emergency Department (HOSPITAL_COMMUNITY)
Admission: EM | Admit: 2015-04-18 | Discharge: 2015-04-18 | Disposition: A | Discharge status: Home / Self Care | Payer: Self-pay | Attending: Emergency Medicine | Admitting: Emergency Medicine

## 2015-04-18 DIAGNOSIS — M25511 Pain in right shoulder: Secondary | ICD-10-CM

## 2015-04-18 DIAGNOSIS — T148XXA Other injury of unspecified body region, initial encounter: Secondary | ICD-10-CM

## 2015-04-18 DIAGNOSIS — Z880 Allergy status to penicillin: Secondary | ICD-10-CM | POA: Insufficient documentation

## 2015-04-18 DIAGNOSIS — R202 Paresthesia of skin: Secondary | ICD-10-CM

## 2015-04-18 DIAGNOSIS — Z87442 Personal history of urinary calculi: Secondary | ICD-10-CM | POA: Insufficient documentation

## 2015-04-18 DIAGNOSIS — Y9289 Other specified places as the place of occurrence of the external cause: Secondary | ICD-10-CM | POA: Insufficient documentation

## 2015-04-18 DIAGNOSIS — S4991XA Unspecified injury of right shoulder and upper arm, initial encounter: Secondary | ICD-10-CM | POA: Insufficient documentation

## 2015-04-18 DIAGNOSIS — M7581 Other shoulder lesions, right shoulder: Secondary | ICD-10-CM | POA: Insufficient documentation

## 2015-04-18 DIAGNOSIS — Z720 Tobacco use: Secondary | ICD-10-CM | POA: Insufficient documentation

## 2015-04-18 DIAGNOSIS — X503XXA Overexertion from repetitive movements, initial encounter: Secondary | ICD-10-CM

## 2015-04-18 DIAGNOSIS — Y9389 Activity, other specified: Secondary | ICD-10-CM | POA: Insufficient documentation

## 2015-04-18 DIAGNOSIS — X509XXA Other and unspecified overexertion or strenuous movements or postures, initial encounter: Secondary | ICD-10-CM | POA: Insufficient documentation

## 2015-04-18 DIAGNOSIS — M778 Other enthesopathies, not elsewhere classified: Secondary | ICD-10-CM

## 2015-04-18 DIAGNOSIS — M62838 Other muscle spasm: Secondary | ICD-10-CM | POA: Insufficient documentation

## 2015-04-18 DIAGNOSIS — Y990 Civilian activity done for income or pay: Secondary | ICD-10-CM | POA: Insufficient documentation

## 2015-04-18 MED ORDER — NAPROXEN 250 MG PO TABS
500.0000 mg | ORAL_TABLET | Freq: Once | ORAL | Status: AC
  Administered 2015-04-18: 500 mg via ORAL
  Filled 2015-04-18: qty 2

## 2015-04-18 MED ORDER — CYCLOBENZAPRINE HCL 10 MG PO TABS
10.0000 mg | ORAL_TABLET | Freq: Once | ORAL | Status: AC
  Administered 2015-04-18: 10 mg via ORAL
  Filled 2015-04-18: qty 1

## 2015-04-18 MED ORDER — PREDNISONE 20 MG PO TABS: ORAL_TABLET | ORAL | Status: AC

## 2015-04-18 MED ORDER — CYCLOBENZAPRINE HCL 10 MG PO TABS: 10.0000 mg | ORAL_TABLET | Freq: Three times a day (TID) | ORAL | Status: AC | PRN

## 2015-04-18 MED ORDER — NAPROXEN 500 MG PO TABS: 500.0000 mg | ORAL_TABLET | Freq: Two times a day (BID) | ORAL | Status: AC | PRN

## 2015-04-18 MED ORDER — HYDROCODONE-ACETAMINOPHEN 5-325 MG PO TABS: 1.0000 | ORAL_TABLET | Freq: Four times a day (QID) | ORAL | Status: AC | PRN

## 2015-04-18 NOTE — ED Provider Notes (Signed)
CSN: [id number]     Arrival date & time 04/18/15  2053 History  By signing my name below, I, Jarvis Morganaylor Ferguson, attest that this documentation has been prepared under the direction and in the presence of Levi StraussMercedes Camprubi-Soms, PA-C Electronically Signed: Jarvis Morganaylor Ferguson, ED Scribe. 04/18/2015. 9:03 PM.      No chief complaint on file.  Patient is a 29 y.o. female presenting with shoulder pain. The history is provided by the patient. No language interpreter was used.  Shoulder Pain Location:  Shoulder Time since incident:  1 week Injury: no   Shoulder location:  R shoulder Pain details:    Quality:  Aching and dull   Radiates to:  R arm   Severity:  Moderate (5/10)   Onset quality:  Gradual   Duration:  1 week   Timing:  Constant   Progression:  Unchanged Chronicity:  New Handedness:  Right-handed Dislocation: no   Prior injury to area:  No Relieved by:  None tried Worsened by:  Movement Ineffective treatments:  None tried Associated symptoms: numbness and tingling   Associated symptoms: no decreased range of motion, no fever, no muscle weakness, no neck pain and no swelling     HPI Comments: Vanessa Carney is a 29 y.o. female with no PMHx who presents to the Emergency Department complaining of constant, moderate, aching and dull, 5/10, right shoulder pain that radiates throughout her entire arm onset 1 week. She reports associated tingling/numbness in her right arm and fingertips which has been present for quite some time. She states the pain is exacerbated with movement and use of the arm. Pt has not taken any medication for the pain prior to arrival. 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 Pt denies any known trauma or injury to the arm, but she does a lot of repetitive movements at her job. She is R handed. She denies any neck pain, arm or shoulder swelling, shoulder bruising/redness/warmth, fever, chills, chest pain,  SOB, abdominal pain, nausea, vomiting, diarrhea, constipation, difficulty urinating, dysuria, hematuria, or focal weakness.    Past Medical History  Diagnosis Date  . Kidney stone   . Head trauma    Past Surgical History  Procedure Laterality Date  . Cesarean section     No family history on file. Social History  Substance Use Topics  . Smoking status: Current Every Day Smoker    Types: Cigarettes  . Smokeless tobacco: Not on file  . Alcohol Use: No   OB History    No data available     Review of Systems  Constitutional: Negative for fever and chills.  Respiratory: Negative for shortness of breath.   Cardiovascular: Negative for chest pain.  Gastrointestinal: Negative for nausea, vomiting, abdominal pain, diarrhea and constipation.  Genitourinary: Negative for dysuria, hematuria and difficulty urinating.  Musculoskeletal: Positive for myalgias. Negative for joint swelling and neck pain.  Skin: Negative for color change.  Allergic/Immunologic: Negative for immunocompromised state.  Neurological: Positive for numbness (tingling R fingertips). Negative for weakness.  Psychiatric/Behavioral: Negative for confusion.  10 Systems reviewed and all are negative for acute change except as noted in the HPI.      Allergies  Penicillins  Home Medications   Prior to Admission medications   Medication Sig Start Date End Date Taking? Authorizing Provider  acetaminophen-codeine (TYLENOL #3) 300-30 MG per tablet Take 1-2 tablets by mouth every 6 (six) hours as needed for  severe pain. Patient not taking: Reported on 08/25/2014 09/21/13   Francee Piccolo, PA-C  Aspirin-Salicylamide-Caffeine (BC HEADACHE POWDER PO) Take 1 packet by mouth once as needed (pain.).    Historical Provider, MD  ibuprofen (ADVIL,MOTRIN) 800 MG tablet Take 1 tablet (800 mg total) by mouth 3 (three) times daily. Patient not taking: Reported on 08/25/2014 09/21/13   Francee Piccolo, PA-C   Triage Vitals: BP  103/61 mmHg  Pulse 65  Temp(Src) 97.8 F (36.6 C) (Oral)  Resp 16  SpO2 100%  Physical Exam  Constitutional: She is oriented to person, place, and time. Vital signs are normal. She appears well-developed and well-nourished.  Non-toxic appearance. No distress.  Afebrile, nontoxic, NAD  HENT:  Head: Normocephalic and atraumatic.  Mouth/Throat: Mucous membranes are normal.  Eyes: Conjunctivae and EOM are normal. Right eye exhibits no discharge. Left eye exhibits no discharge.  Neck: Normal range of motion. Neck supple.  Cardiovascular: Normal rate and intact distal pulses.   Pulmonary/Chest: Effort normal. No respiratory distress.  Abdominal: Normal appearance. She exhibits no distension.  Musculoskeletal: Normal range of motion.       Right shoulder: She exhibits tenderness and spasm. She exhibits normal range of motion, no bony tenderness, no swelling, no effusion, no crepitus, no deformity, normal pulse and normal strength.       Arms: Right shoulder with FROM intact, no bony TTP, with diffuse muscular and trapezius TTP and spasm, no swelling/effusion, no crepitus/deformity, +apley scratch, +pain with resisted int/ext rotation, neg empty can test. Strength and sensation grossly intact in all extremities, although she reports "different" sensation to the distal fingertips. distal pulses intact.    Neurological: She is alert and oriented to person, place, and time. She has normal strength. No sensory deficit.  Skin: Skin is warm, dry and intact. No rash noted.  Psychiatric: She has a normal mood and affect. Her behavior is normal.  Nursing note and vitals reviewed.   ED Course  Procedures (including critical care time)  DIAGNOSTIC STUDIES: Oxygen Saturation is 100% on RA, normal by my interpretation.    COORDINATION OF CARE: 9:14 PM- Pt is not driving home, will give Naprosyn  and Flexeril . Will give rx for prednisone and provide referral to [facility] to  follow up with as PCP. Pt advised of plan for treatment and pt agrees.     Labs Review Labs Reviewed - No data to display  Imaging Review No results found. I have personally reviewed and evaluated these images and lab results as part of my medical decision-making.   EKG Interpretation None      MDM   Final diagnoses:  Right shoulder pain  Right shoulder tendinitis  Repetitive motion injury  Paresthesia of arm    29 y.o. female here with likely repetitive motion strain injury to R shoulder, some paresthesia endorsed in fingertips, but overall arm is NVI with soft compartments. 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 No bony tenderness, all tenderness is in muscles surrounding shoulder/rotator cuff. Will give naprosyn/flexeril here, will send home with naprosyn/norco/flexeril and prednisone to help with paresthesia. No cervical spine tenderness, doubt need for imaging at this time. Will have her f/up with ortho in 1-2wks. Discussed rest, ice/heat, and ROM exercises. Also given f/up with [facility] to establish medical care. I explained the diagnosis and have given explicit precautions to return to the ER including for any other new or worsening symptoms. The patient understands and accepts the  medical plan as it's been dictated and I have answered their questions. Discharge instructions concerning home care and prescriptions have been given. The patient is STABLE and is discharged to home in good condition.   I personally performed the services described in this documentation, which was scribed in my presence. The recorded information has been reviewed and is accurate.  BP 103/61 mmHg  Pulse 65  Temp(Src) 97.8 F (36.6 C) (Oral)  Resp 16  SpO2 100%  Meds ordered this encounter  Medications  . naproxen (NAPROSYN) tablet 500 mg    Sig:   . cyclobenzaprine (FLEXERIL) tablet 10 mg    Sig:   . cyclobenzaprine (FLEXERIL) 10 MG tablet    Sig: Take 1  tablet (10 mg total) by mouth 3 (three) times daily as needed for muscle spasms.    Dispense:  15 tablet    Refill:  0    Order Specific Question:  Supervising Provider    Answer:  MILLER, BRIAN [[id number]]  . HYDROcodone-acetaminophen (NORCO) 5-325 MG tablet    Sig: Take 1 tablet by mouth every 6 (six) hours as needed for severe pain.    Dispense:  6 tablet    Refill:  0    Order Specific Question:  Supervising Provider    Answer:  MILLER, BRIAN [[id number]]  . naproxen (NAPROSYN) 500 MG tablet    Sig: Take 1 tablet (500 mg total) by mouth 2 (two) times daily as needed for mild pain, moderate pain or headache (TAKE WITH MEALS.).    Dispense:  20 tablet    Refill:  0    Order Specific Question:  Supervising Provider    Answer:  MILLER, BRIAN [[id number]]  . predniSONE (DELTASONE) 20 MG tablet    Sig: 3 tabs po daily x 4 days    Dispense:  12 tablet    Refill:  0    Order Specific Question:  Supervising Provider    Answer:  Angus Seller Camprubi-Soms, PA-C 04/18/15 2123  Lavera Guise, MD 04/19/15 [phone]

## 2015-04-18 NOTE — Discharge Instructions (Signed)
Rest shoulder for the next 2-3 days, then begin performing gentle range of motion exercises. Use heat and ice to your shoulder throughout the day, alternating using a heat and an ice pack for 20 minutes at a time every hour for each. Alternate between naprosyn and norco for pain relief, use flexeril for muscle spasms. Do not drive or operate machinery with pain medication or muscle relaxant use. Use prednisone as directed to help with the numbness/tingling feeling. Call orthopedic follow up today or tomorrow to schedule followup appointment for recheck of ongoing shoulder pain in 1-2 weeks that can be canceled with a 24-48 hour notice if complete resolution of pain. Return to the ER for changes or worsening symptoms.   Shoulder Pain The shoulder is the joint that connects your arms to your body. The bones that form the shoulder joint include the upper arm bone (humerus), the shoulder blade (scapula), and the collarbone (clavicle). The top of the humerus is shaped like a ball and fits into a rather flat socket on the scapula (glenoid cavity). A combination of muscles and strong, fibrous tissues that connect muscles to bones (tendons) support your shoulder joint and hold the ball in the socket. Small, fluid-filled sacs (bursae) are located in different areas of the joint. They act as cushions between the bones and the overlying soft tissues and help reduce friction between the gliding tendons and the bone as you move your arm. Your shoulder joint allows a wide range of motion in your arm. This range of motion allows you to do things like scratch your back or throw a ball. However, this range of motion also makes your shoulder more prone to pain from overuse and injury. Causes of shoulder pain can originate from both injury and overuse and usually can be grouped in the following four categories:  Redness, swelling, and pain (inflammation) of the tendon (tendinitis) or the bursae (bursitis).  Instability, such  as a dislocation of the joint.  Inflammation of the joint (arthritis).  Broken bone (fracture). HOME CARE INSTRUCTIONS   Apply ice to the sore area.  Put ice in a plastic bag.  Place a towel between your skin and the bag.  Leave the ice on for 15-20 minutes, 3-4 times per day for the first 2 days, or as directed by your health care provider.  Stop using cold packs if they do not help with the pain.  If you have a shoulder sling or immobilizer, wear it as long as your caregiver instructs. Only remove it to shower or bathe. Move your arm as little as possible, but keep your hand moving to prevent swelling.  Squeeze a soft ball or foam pad as much as possible to help prevent swelling.  Only take over-the-counter or prescription medicines for pain, discomfort, or fever as directed by your caregiver. SEEK MEDICAL CARE IF:   Your shoulder pain increases, or new pain develops in your arm, hand, or fingers.  Your hand or fingers become cold and numb.  Your pain is not relieved with medicines. SEEK IMMEDIATE MEDICAL CARE IF:   Your arm, hand, or fingers are numb or tingling.  Your arm, hand, or fingers are significantly swollen or turn white or blue. MAKE SURE YOU:  1. Understand these instructions. 2. Will watch your condition. 3. Will get help right away if you are not doing well or get worse.   This information is not intended to replace advice given to you by your health care provider. Make sure  you discuss any questions you have with your health care provider.   Document Released: 03/14/2005 Document Revised: 06/25/2014 Document Reviewed: 09/27/2014 Elsevier Interactive Patient Education 2016 Else[employer] Inc.  Shoulder Range of Motion Exercises Shoulder range of motion (ROM) exercises are designed to keep the shoulder moving freely. They are often recommended for people who have shoulder pain. MOVEMENT EXERCISE When you are able, do this exercise 5-6 days per week, or as told  by your health care provider. Work toward doing 2 sets of 10 swings. Pendulum Exercise How To Do This Exercise Lying Down  Lie face-down on a bed with your abdomen close to the side of the bed.  Let your arm hang over the side of the bed.  Relax your shoulder, arm, and hand.  Slowly and gently swing your arm forward and back. Do not use your neck muscles to swing your arm. They should be relaxed. If you are struggling to swing your arm, have someone gently swing it for you. When you do this exercise for the first time, swing your arm at a 15 degree angle for 15 seconds, or swing your arm 10 times. As pain lessens over time, increase the angle of the swing to 30-45 degrees.  Repeat steps 1-4 with the other arm. How To Do This Exercise While Standing  Stand next to a sturdy chair or table and hold on to it with your hand.  Bend forward at the waist.  Bend your knees slightly.  Relax your other arm and let it hang limp.  Relax the shoulder blade of the arm that is hanging and let it drop.  While keeping your shoulder relaxed, use body motion to swing your arm in small circles. The first time you do this exercise, swing your arm for about 30 seconds or 10 times. When you do it next time, swing your arm for a little longer.  Stand up tall and relax.  Repeat steps 1-7, this time changing the direction of the circles.  Repeat steps 1-8 with the other arm. STRETCHING EXERCISES Do these exercises 3-4 times per day on 5-6 days per week or as told by your health care provider. Work toward holding the stretch for 20 seconds. Stretching Exercise 1  Lift your arm straight out in front of you.  Bend your arm 90 degrees at the elbow (right angle) so your forearm goes across your body and looks like the letter "L."  Use your other arm to gently pull the elbow forward and across your body.  Repeat steps 1-3 with the other arm. Stretching Exercise 2 You will need a towel or rope for this  exercise.  Bend one arm behind your back with the palm facing outward.  Hold a towel with your other hand.  Reach the arm that holds the towel above your head, and bend that arm at the elbow. Your wrist should be behind your neck.  Use your free hand to grab the free end of the towel.  With the higher hand, gently pull the towel up behind you.  With the lower hand, pull the towel down behind you.  Repeat steps 1-6 with the other arm. STRENGTHENING EXERCISES Do each of these exercises at four different times of day (sessions) every day or as told by your health care provider. To begin with, repeat each exercise 5 times (repetitions). Work toward doing 3 sets of 12 repetitions or as told by your health care provider. Strengthening Exercise 1 You will need a  light weight for this activity. As you grow stronger, you may use a heavier weight. 4. Standing with a weight in your hand, lift your arm straight out to the side until it is at the same height as your shoulder. 5. Bend your arm at 90 degrees so that your fingers are pointing to the ceiling. 6. Slowly raise your hand until your arm is straight up in the air. 7. Repeat steps 1-3 with the other arm. Strengthening Exercise 2 You will need a light weight for this activity. As you grow stronger, you may use a heavier weight. 1. Standing with a weight in your hand, gradually move your straight arm in an arc, starting at your side, then out in front of you, then straight up over your head. 2. Gradually move your other arm in an arc, starting at your side, then out in front of you, then straight up over your head. 3. Repeat steps 1-2 with the other arm. Strengthening Exercise 3 You will need an elastic band for this activity. As you grow stronger, gradually increase the size of the bands or increase the number of bands that you use at one time. 1. While standing, hold an elastic band in one hand and raise that arm up in the air. 2. With your  other hand, pull down the band until that hand is by your side. 3. Repeat steps 1-2 with the other arm.   This information is not intended to replace advice given to you by your health care provider. Make sure you discuss any questions you have with your health care provider.   Document Released: 03/03/2003 Document Revised: 10/19/2014 Document Reviewed: 05/31/2014 Elsevier Interactive Patient Education 2016 Elsevier Inc.  Paresthesia Paresthesia is a burning or prickling feeling. This feeling can happen in any part of the body. It often happens in the hands, arms, legs, or feet. Usually, it is not painful. In most cases, the feeling goes away in a short time and is not a sign of a serious problem. HOME CARE  Avoid drinking alcohol.  Try massage or needle therapy (acupuncture) to help with your problems.  Keep all follow-up visits as told by your doctor. This is important. GET HELP IF:  You keep on having episodes of paresthesia.  Your burning or prickling feeling gets worse when you walk.  You have pain or cramps.  You feel dizzy.  You have a rash. GET HELP RIGHT AWAY IF:  You feel weak.  You have trouble walking or moving.  You have problems speaking, understanding, or seeing.  You feel confused.  You cannot control when you pee (urinate) or poop (bowel movement).  You lose feeling (numbness) after an injury.  You pass out (faint).   This information is not intended to replace advice given to you by your health care provider. Make sure you discuss any questions you have with your health care provider.   Document Released: 05/17/2008 Document Revised: 10/19/2014 Document Reviewed: 05/31/2014 Elsevier Interactive Patient Education 2016 Else[employer] Inc.  [employer] Therapy Heat therapy can help ease sore, stiff, injured, and tight muscles and joints. Heat relaxes your muscles, which may help ease your pain. Heat therapy should only be used on old, pre-existing, or  long-lasting (chronic) injuries. Do not use heat therapy unless told by your doctor. HOW TO USE HEAT THERAPY There are several different kinds of heat therapy, including:  Moist heat pack.  Warm water bath.  Hot water bottle.  Electric heating pad.  Heated gel  pack.  Heated wrap.  Electric heating pad. GENERAL HEAT THERAPY RECOMMENDATIONS   Do not sleep while using heat therapy. Only use heat therapy while you are awake.  Your skin may turn pink while using heat therapy. Do not use heat therapy if your skin turns red.  Do not use heat therapy if you have new pain.  High heat or long exposure to heat can cause burns. Be careful when using heat therapy to avoid burning your skin.  Do not use heat therapy on areas of your skin that are already irritated, such as with a rash or sunburn. GET HELP IF:   You have blisters, redness, swelling (puffiness), or numbness.  You have new pain.  Your pain is worse. MAKE SURE YOU:  Understand these instructions.  Will watch your condition.  Will get help right away if you are not doing well or get worse.   This information is not intended to replace advice given to you by your health care provider. Make sure you discuss any questions you have with your health care provider.   Document Released: 08/27/2011 Document Revised: 06/25/2014 Document Reviewed: 07/28/2013 Elsevier Interactive Patient Education 2016 Elsevier Inc.  Cryotherapy Cryotherapy is when you put ice on your injury. Ice helps lessen pain and puffiness (swelling) after an injury. Ice works the best when you start using it in the first 24 to 48 hours after an injury. HOME CARE  Put a dry or damp towel between the ice pack and your skin.  You may press gently on the ice pack.  Leave the ice on for no more than 10 to 20 minutes at a time.  Check your skin after 5 minutes to make sure your skin is okay.  Rest at least 20 minutes between ice pack uses.  Stop  using ice when your skin loses feeling (numbness).  Do not use ice on someone who cannot tell you when it hurts. This includes small children and people with memory problems (dementia). GET HELP RIGHT AWAY IF:  You have white spots on your skin.  Your skin turns blue or pale.  Your skin feels waxy or hard.  Your puffiness gets worse. MAKE SURE YOU:   Understand these instructions.  Will watch your condition.  Will get help right away if you are not doing well or get worse.   This information is not intended to replace advice given to you by your health care provider. Make sure you discuss any questions you have with your health care provider.   Document Released: 11/21/2007 Document Revised: 08/27/2011 Document Reviewed: 01/25/2011 Elsevier Interactive Patient Education [employer]

## 2015-04-18 NOTE — ED Notes (Signed)
Pt reports right shoulder pain that radiates down her arm.  Reports that her fingers on her right had are numb and swollen.

## 2015-10-12 ENCOUNTER — Encounter (HOSPITAL_COMMUNITY): Payer: Self-pay | Admitting: *Deleted

## 2015-10-12 ENCOUNTER — Emergency Department (HOSPITAL_COMMUNITY)
Admission: EM | Admit: 2015-10-12 | Discharge: 2015-10-12 | Disposition: A | Discharge status: Home / Self Care | Payer: Self-pay | Attending: Emergency Medicine | Admitting: Emergency Medicine

## 2015-10-12 DIAGNOSIS — M542 Cervicalgia: Secondary | ICD-10-CM | POA: Insufficient documentation

## 2015-10-12 DIAGNOSIS — F17210 Nicotine dependence, cigarettes, uncomplicated: Secondary | ICD-10-CM | POA: Insufficient documentation

## 2015-10-12 DIAGNOSIS — Z87442 Personal history of urinary calculi: Secondary | ICD-10-CM | POA: Insufficient documentation

## 2015-10-12 DIAGNOSIS — M25511 Pain in right shoulder: Secondary | ICD-10-CM | POA: Insufficient documentation

## 2015-10-12 DIAGNOSIS — Z87828 Personal history of other (healed) physical injury and trauma: Secondary | ICD-10-CM | POA: Insufficient documentation

## 2015-10-12 DIAGNOSIS — Z880 Allergy status to penicillin: Secondary | ICD-10-CM | POA: Insufficient documentation

## 2015-10-12 MED ORDER — IBUPROFEN 800 MG PO TABS: 800.0000 mg | ORAL_TABLET | Freq: Three times a day (TID) | ORAL | Status: AC

## 2015-10-12 MED ORDER — METHOCARBAMOL 500 MG PO TABS
500.0000 mg | ORAL_TABLET | Freq: Once | ORAL | Status: AC
  Administered 2015-10-12: 500 mg via ORAL
  Filled 2015-10-12: qty 1

## 2015-10-12 MED ORDER — IBUPROFEN 400 MG PO TABS
800.0000 mg | ORAL_TABLET | Freq: Once | ORAL | Status: AC
  Administered 2015-10-12: 800 mg via ORAL
  Filled 2015-10-12: qty 4

## 2015-10-12 MED ORDER — METHOCARBAMOL 500 MG PO TABS: 500.0000 mg | ORAL_TABLET | Freq: Two times a day (BID) | ORAL | Status: AC

## 2015-10-12 NOTE — ED Notes (Signed)
Pt has been having a right sided neck pain and it hurts to try to move right arm up. Started yesterday after heaving lifting yesterday.

## 2015-10-12 NOTE — Discharge Instructions (Signed)
1. Medications: ibuprofen, robaxin, usual home medications 2. Treatment: rest, drink plenty of fluids 3. Follow Up: please followup with your primary doctor for discussion of your diagnoses and further evaluation after today's visit; if you do not have a primary care doctor use the phone number listed in your discharge paperwork to find one; please return to the ER for increased pain, numbness, weakness, new or worsening symptoms   Musculoskeletal Pain Musculoskeletal pain is muscle and boney aches and pains. These pains can occur in any part of the body. Your caregiver may treat you without knowing the cause of the pain. They may treat you if blood or urine tests, X-rays, and other tests were normal.  CAUSES There is often not a definite cause or reason for these pains. These pains may be caused by a type of germ (virus). The discomfort may also come from overuse. Overuse includes working out too hard when your body is not fit. Boney aches also come from weather changes. Bone is sensitive to atmospheric pressure changes. HOME CARE INSTRUCTIONS   Ask when your test results will be ready. Make sure you get your test results.  Only take over-the-counter or prescription medicines for pain, discomfort, or fever as directed by your caregiver. If you were given medications for your condition, do not drive, operate machinery or power tools, or sign legal documents for 24 hours. Do not drink alcohol. Do not take sleeping pills or other medications that may interfere with treatment.  Continue all activities unless the activities cause more pain. When the pain lessens, slowly resume normal activities. Gradually increase the intensity and duration of the activities or exercise.  During periods of severe pain, bed rest may be helpful. Lay or sit in any position that is comfortable.  Putting ice on the injured area.  Put ice in a bag.  Place a towel between your skin and the bag.  Leave the ice on for 15  to 20 minutes, 3 to 4 times a day.  Follow up with your caregiver for continued problems and no reason can be found for the pain. If the pain becomes worse or does not go away, it may be necessary to repeat tests or do additional testing. Your caregiver may need to look further for a possible cause. SEEK IMMEDIATE MEDICAL CARE IF:  You have pain that is getting worse and is not relieved by medications.  You develop chest pain that is associated with shortness or breath, sweating, feeling sick to your stomach (nauseous), or throw up (vomit).  Your pain becomes localized to the abdomen.  You develop any new symptoms that seem different or that concern you. MAKE SURE YOU:   Understand these instructions.  Will watch your condition.  Will get help right away if you are not doing well or get worse.   This information is not intended to replace advice given to you by your health care provider. Make sure you discuss any questions you have with your health care provider.   Document Released: 06/04/2005 Document Revised: 08/27/2011 Document Reviewed: 02/06/2013 Elsevier Interactive Patient Education [employer] Inc[employer]

## 2015-10-12 NOTE — ED Provider Notes (Signed)
CSN: [id number]     Arrival date & time 10/12/15  1213 History  By signing my name below, I, Tanda RockersMargaux Venter, attest that this documentation has been prepared under the direction and in the presence of Glean HessElizabeth Cherilynn Schomburg, [address]. Electronically Signed: Tanda RockersMargaux Venter, ED Scribe. 10/12/2015. 1:54 PM.   Chief Complaint  Patient presents with  . Shoulder Pain    The history is provided by the patient. No language interpreter was used.     HPI Comments: Vanessa BreachJena Carney is a 30 y.o. female who presents to the Emergency Department complaining of gradual onset, constant, right lateral neck pain and right shoulder pain that began this morning. No known injury. Pt reports that she was doing heavy lifting yesterday while moving out of her house, causing the pain. The pain is exacerbated with lifting the right arm. She has been taking goody powder without relief. 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 Denies weakness, numbness, tingling, or any other associated symptoms.    Past Medical History  Diagnosis Date  . Kidney stone   . Head trauma    Past Surgical History  Procedure Laterality Date  . Cesarean section     No family history on file. Social History  Substance Use Topics  . Smoking status: Current Every Day Smoker    Types: Cigarettes  . Smokeless tobacco: None  . Alcohol Use: No   OB History    No data available      Review of Systems  Musculoskeletal: Positive for arthralgias (right shoulder) and neck pain.  Neurological: Negative for weakness and numbness.    Allergies  Penicillins  Home Medications   Prior to Admission medications   Medication Sig Start Date End Date Taking? Authorizing Provider  acetaminophen-codeine (TYLENOL #3) 300-30 MG per tablet Take 1-2 tablets by mouth every 6 (six) hours as needed for severe pain. Patient not taking: Reported on 08/25/2014 09/21/13   Francee PiccoloJennifer Piepenbrink, PA-C  cyclobenzaprine (FLEXERIL) 10 MG tablet Take 1  tablet (10 mg total) by mouth 3 (three) times daily as needed for muscle spasms. 04/18/15   Mercedes Camprubi-Soms, PA-C  HYDROcodone-acetaminophen (NORCO) 5-325 MG tablet Take 1 tablet by mouth every 6 (six) hours as needed for severe pain. 04/18/15   Mercedes Camprubi-Soms, PA-C  ibuprofen (ADVIL,MOTRIN) 800 MG tablet Take 1 tablet (800 mg total) by mouth 3 (three) times daily. Patient not taking: Reported on 08/25/2014 09/21/13   Francee PiccoloJennifer Piepenbrink, PA-C  naphazoline-pheniramine (NAPHCON-A) 0.025-0.3 % ophthalmic solution Place 1 drop into both eyes 4 (four) times daily as needed for irritation.    Historical Provider, MD  naproxen (NAPROSYN) 500 MG tablet Take 1 tablet (500 mg total) by mouth 2 (two) times daily as needed for mild pain, moderate pain or headache (TAKE WITH MEALS.). 04/18/15   Mercedes Camprubi-Soms, PA-C  predniSONE (DELTASONE) 20 MG tablet 3 tabs po daily x 4 days 04/18/15   Mercedes Camprubi-Soms, PA-C    BP 129/92 mmHg  Pulse 91  Temp(Src) 98.1 F (36.7 C) (Oral)  Resp 16  SpO2 99%  LMP 10/10/2015 Physical Exam  Constitutional: She is oriented to person, place, and time. She appears well-developed and well-nourished. No distress.  HENT:  Head: Normocephalic and atraumatic.  Right Ear: External ear normal.  Left Ear: External ear normal.  Nose: Nose normal.  Eyes: Conjunctivae and EOM are normal. Right eye exhibits no discharge. Left eye exhibits no discharge. No scleral icterus.  Neck: Normal range of motion. Neck supple.  Cardiovascular: Normal rate and regular rhythm.   Pulmonary/Chest: Effort normal and breath sounds normal. No respiratory distress.  Musculoskeletal: She exhibits tenderness. She exhibits no edema.  TTP to right trapezius and right AC joint with decreased ROM of RUE due to pain. Strength, sensation, and pulses intact. No midline cervical tenderness.   Neurological: She is alert and oriented to person, place, and time. She has normal strength. No  sensory deficit.  Skin: Skin is warm and dry. She is not diaphoretic.  Psychiatric: She has a normal mood and affect. Her behavior is normal.  Nursing note and vitals reviewed.    ED Course  Procedures (including critical care time)  DIAGNOSTIC STUDIES: Oxygen Saturation is 99% on RA, normal by my interpretation.    COORDINATION OF CARE: 1:51 PM-Discussed treatment plan which includes Rx NSAIDs and muscle relaxant with pt at bedside and pt agreed to plan.   Labs Review Labs Reviewed - No data to display  Imaging Review No results found.    EKG Interpretation None       MDM   Final diagnoses:  None    30 year old female presents with right-sided neck and shoulder pain. States this has been a problem in the past, and recurred yesterday after lifting heavy objects around her house. Denies numbness, weakness, paresthesia. Patient is afebrile. Vital signs stable. On exam, she has TTP to her right trapezius and right AC joint with decreased range of motion due to pain. Patient is neurovascularly intact. Patient declines imaging at this time. Will treat with anti-inflammatory and muscle relaxant. Patient to follow up with PCP. Strict return precautions discussed. Patient verbalizes her understanding and is in agreement with plan.  BP 135/78 mmHg  Pulse 50  Temp(Src) 98.1 F (36.7 C) (Oral)  Resp 18  SpO2 100%  LMP 10/10/2015  I personally performed the services described in this documentation, which was scribed in my presence. The recorded information has been reviewed and is accurate.      Mady Gemma, PA-C 10/12/15 1406  Gerhard Munch, MD 10/12/15 [phone]

## 2015-10-30 IMAGING — CR DG CHEST 2V
2 series · 2 of 2 positions shown · non-contrast
Comparison: None.

CLINICAL DATA: Cough, generalized body aches for 3 days

EXAM:
CHEST  2 VIEW

[w chest pa]
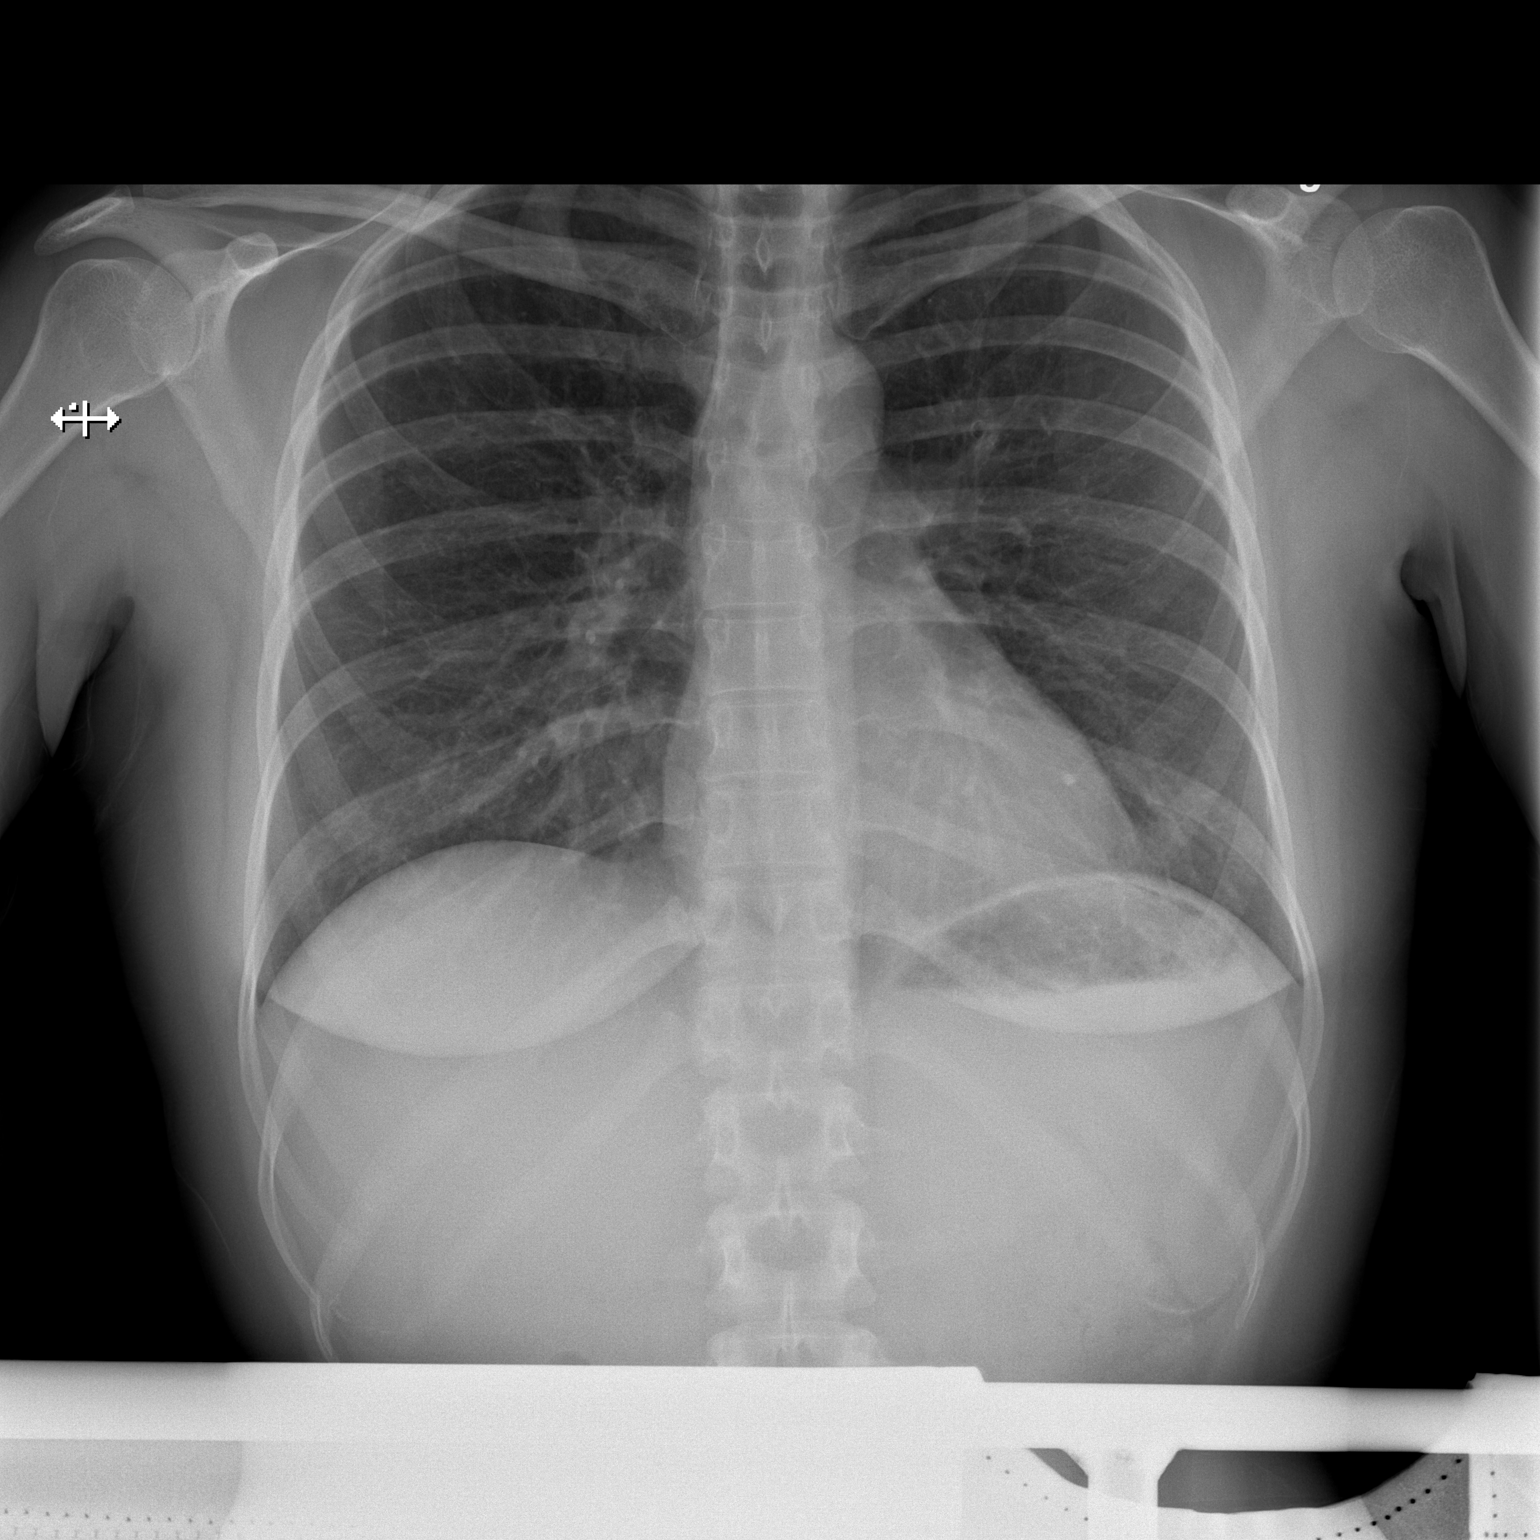

[w chest lat]
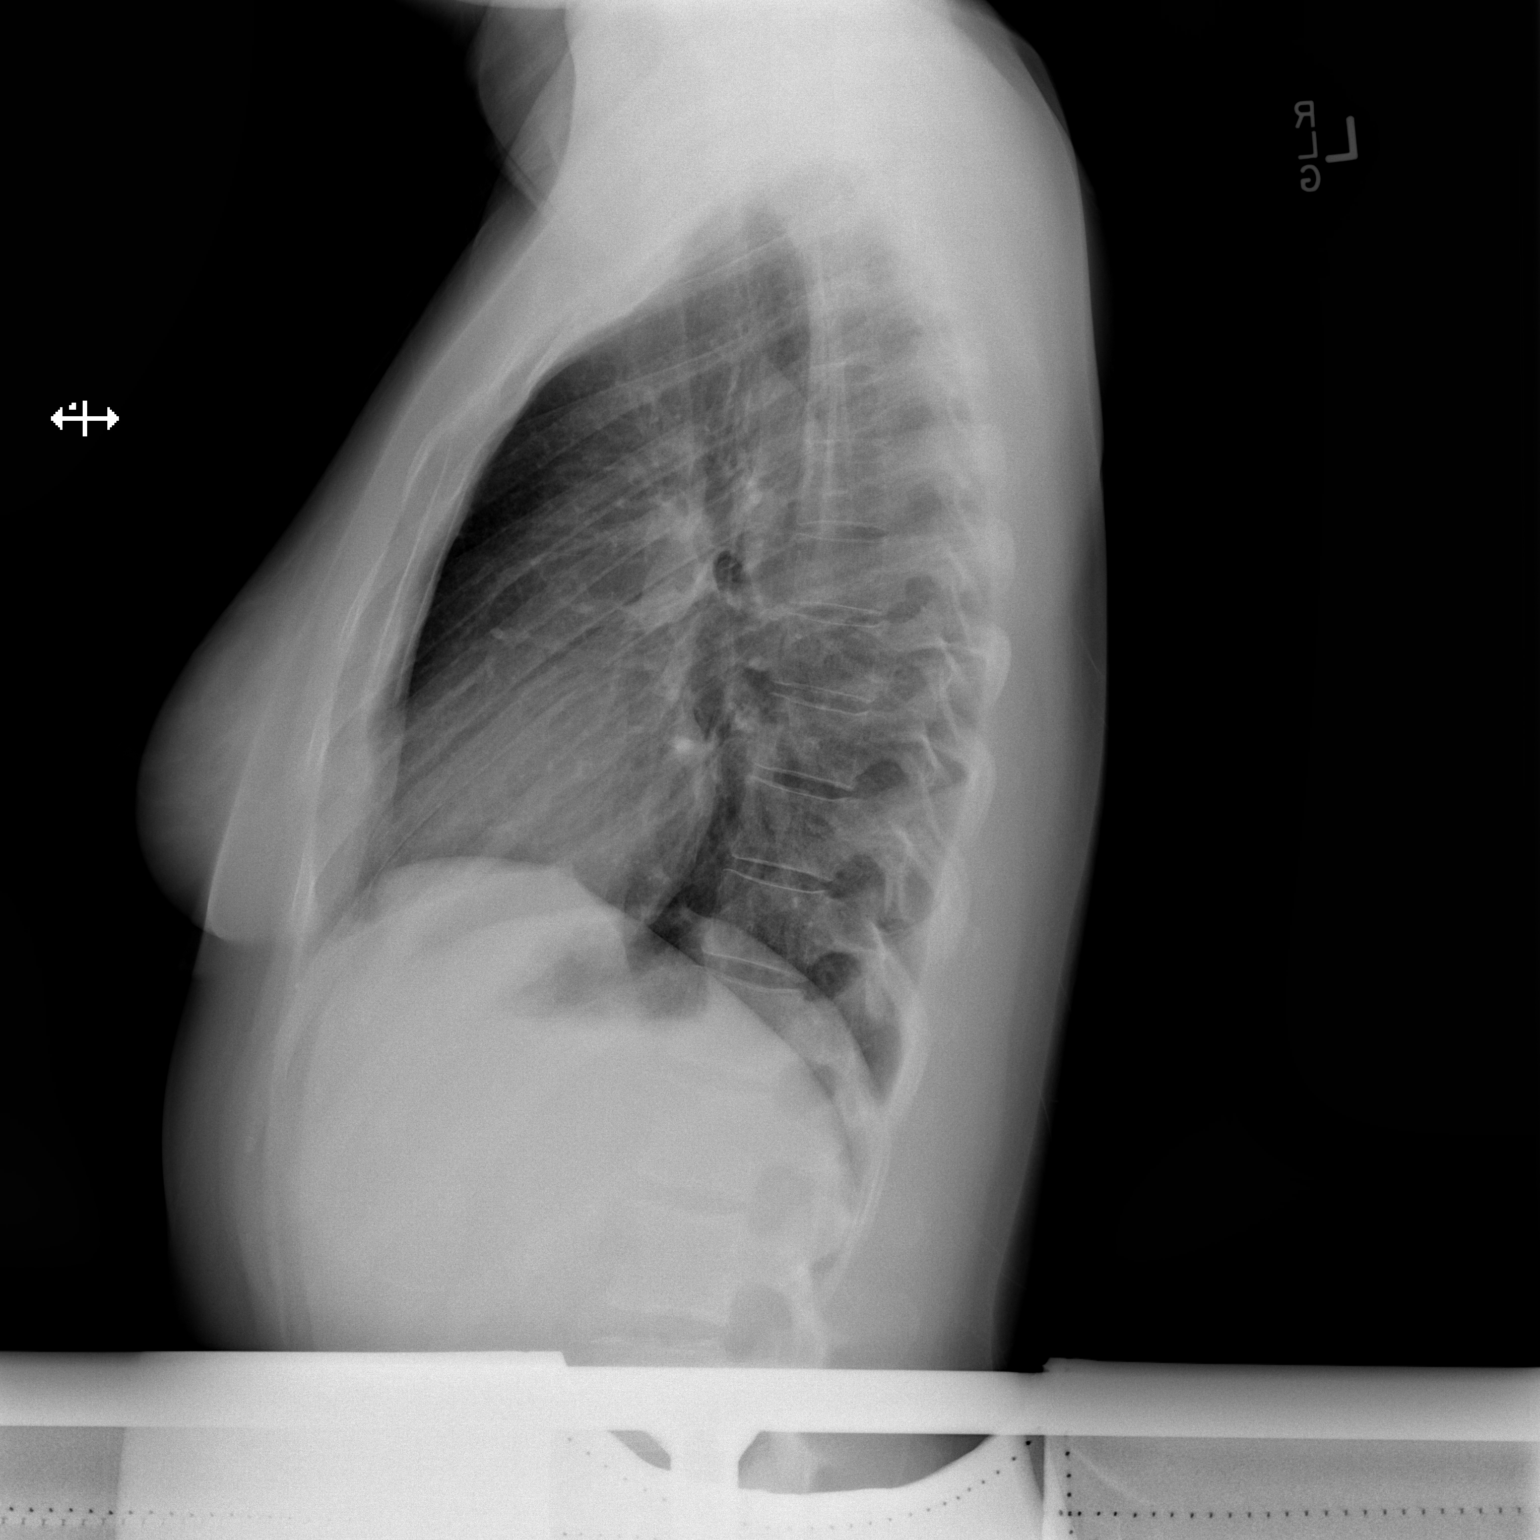

[2 of 2 positions shown; findings below may reference images not displayed]

FINDINGS: Cardiomediastinal silhouette is unremarkable. No acute infiltrate or
pleural effusion. No pulmonary edema. Bony thorax is unremarkable.
IMPRESSION: No active cardiopulmonary disease.
